# Patient Record
Sex: Male | Born: 1990 | Race: White | Hispanic: No | Marital: Single | Smoking: Never smoker
Health system: Southern US, Community
[De-identification: ages and names within clinical notes are randomized; demographics above are authoritative.]

## PROBLEM LIST (undated history)

## (undated) DIAGNOSIS — J45909 Unspecified asthma, uncomplicated: Secondary | ICD-10-CM

## (undated) HISTORY — PX: HAND SURGERY: SHX662

## (undated) HISTORY — PX: WISDOM TOOTH EXTRACTION: SHX21

---
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
## 2016-06-24 ENCOUNTER — Ambulatory Visit
Admission: EM | Admit: 2016-06-24 | Discharge: 2016-06-24 | Disposition: A | Discharge status: Home / Self Care | Payer: Self-pay | Attending: Family Medicine | Admitting: Family Medicine

## 2016-06-24 ENCOUNTER — Encounter: Payer: Self-pay | Admitting: Gynecology

## 2016-06-24 DIAGNOSIS — R059 Cough, unspecified: Secondary | ICD-10-CM

## 2016-06-24 DIAGNOSIS — J209 Acute bronchitis, unspecified: Secondary | ICD-10-CM

## 2016-06-24 DIAGNOSIS — R05 Cough: Secondary | ICD-10-CM

## 2016-06-24 HISTORY — DX: Unspecified asthma, uncomplicated: J45.909

## 2016-06-24 MED ORDER — DEXTROMETHORPHAN HBR 15 MG/5ML PO SYRP: 10.0000 mL | ORAL_SOLUTION | Freq: Four times a day (QID) | ORAL | 0 refills | Status: AC | PRN

## 2016-06-24 MED ORDER — ALBUTEROL SULFATE HFA 108 (90 BASE) MCG/ACT IN AERS: 1.0000 | INHALATION_SPRAY | Freq: Four times a day (QID) | RESPIRATORY_TRACT | 0 refills | Status: AC | PRN

## 2016-06-24 MED ORDER — PREDNISONE 10 MG (21) PO TBPK: 10.0000 mg | ORAL_TABLET | Freq: Every day | ORAL | 0 refills | Status: AC

## 2016-06-24 MED ORDER — MONTELUKAST SODIUM 10 MG PO TABS: 10.0000 mg | ORAL_TABLET | Freq: Every day | ORAL | 2 refills | Status: AC

## 2016-06-24 MED ORDER — AZITHROMYCIN 250 MG PO TABS: 250.0000 mg | ORAL_TABLET | Freq: Every day | ORAL | 0 refills | Status: AC

## 2016-06-24 NOTE — ED Provider Notes (Signed)
CSN: [id number]     Arrival date & time 06/24/16  1423 History   First MD Initiated Contact with Patient 06/24/16 1458     Chief Complaint  Patient presents with  . Cough   (Consider location/radiation/quality/duration/timing/severity/associated sxs/prior Treatment) Pt cough of cough with productive cough for 3 weeks. States that he has tried several things. Is taking antihistamine, mucomyst with very minimal relief. Denies any n/v/d denies any post nasal drip. 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       Past Medical History:  Diagnosis Date  . Asthma    Past Surgical History:  Procedure Laterality Date  . HAND SURGERY Right    child  . WISDOM TOOTH EXTRACTION     No family history on file. Social History  Substance Use Topics  . Smoking status: Never Smoker  . Smokeless tobacco: Never Used  . Alcohol use Yes    Review of Systems  Constitutional: Negative.   HENT: Positive for congestion.   Eyes: Negative.   Respiratory: Positive for cough and wheezing.   Cardiovascular: Negative.   Gastrointestinal: Negative.   Musculoskeletal: Negative.   Neurological: Negative.     Allergies  Patient has no known allergies.  Home Medications   Prior to Admission medications   Medication Sig Start Date End Date Taking? Authorizing Provider  cetirizine (ZYRTEC) 10 MG tablet Take 10 mg by mouth daily.   Yes Historical Provider, MD  fluticasone (FLONASE) 50 MCG/ACT nasal spray Place into both nostrils daily.   Yes Historical Provider, MD  albuterol (PROVENTIL HFA;VENTOLIN HFA) 108 (90 Base) MCG/ACT inhaler Inhale 1-2 puffs into the lungs every 6 (six) hours as needed for wheezing or shortness of breath. 06/24/16   Tobi Bastos, NP  azithromycin (ZITHROMAX) 250 MG tablet Take 1 tablet (250 mg total) by mouth daily. Take first 2 tablets together, then 1 every day until finished. 06/24/16   Tobi Bastos, NP  dextromethorphan 15 MG/5ML syrup Take  10 mLs (30 mg total) by mouth 4 (four) times daily as needed for cough. 06/24/16   Tobi Bastos, NP  montelukast (SINGULAIR) 10 MG tablet Take 1 tablet (10 mg total) by mouth at bedtime. 06/24/16   Tobi Bastos, NP  predniSONE (STERAPRED UNI-PAK 21 TAB) 10 MG (21) TBPK tablet Take 1 tablet (10 mg total) by mouth daily. Take 6 tabs by mouth daily  for 2 days, then 5 tabs for 2 days, then 4 tabs for 2 days, then 3 tabs for 2 days, 2 tabs for 2 days, then 1 tab by mouth daily for 2 days 06/24/16   Tobi Bastos, NP   Meds Ordered and Administered this Visit  Medications - No data to display  BP 138/84 (BP Location: Left Arm)   Pulse 73   Temp 98.5 F (36.9 C) (Oral)   Resp 16   Ht 5\' 9"  (1.753 m)   Wt 175 lb (79.4 kg)   SpO2 100%   BMI 25.84 kg/m  No data found.   Physical Exam  Constitutional: He appears well-developed.  HENT:  Head: Normocephalic.  Eyes: Pupils are equal, round, and reactive to light.  Neck: Normal range of motion.  Cardiovascular: Normal rate and regular rhythm.   Pulmonary/Chest: Effort normal. He has wheezes.  Wheezing in lower base bil   Abdominal: Soft. Bowel sounds are normal.  Musculoskeletal: Normal range of motion.  Neurological: He is alert.  Skin: Skin is  warm. Capillary refill takes less than 2 seconds.    Urgent Care Course     Procedures (including critical care time)  Labs Review Labs Reviewed - No data to display  Imaging Review No results found.           MDM   1. Acute bronchitis, unspecified organism   2. Cough     Take the prednisone and the albuterol inhaler for 3 days and if sx worsen then fill the antibiotic. Use humidifier  Use cough meds as needed. THe cough can persist for several weeks   Tobi BastosMelanie A Brian Kocourek, NP 06/24/16 1516

## 2016-06-24 NOTE — Discharge Instructions (Signed)
Take the prednisone and the albuterol inhaler for 3 days and if sx worsen then fill the antibiotic. Use humidifier  Use cough meds as needed. THe cough can persist for several weeks

## 2016-06-24 NOTE — ED Triage Notes (Signed)
Patient c/o cough x 3 weeks.
